# Patient Record
Sex: Female | Born: 1990 | Race: White | Hispanic: No | Marital: Married | Smoking: Never smoker
Health system: Southern US, Community
[De-identification: ages and names within clinical notes are randomized; demographics above are authoritative.]

## PROBLEM LIST (undated history)

## (undated) DIAGNOSIS — Z789 Other specified health status: Secondary | ICD-10-CM

## (undated) HISTORY — PX: LYMPH GLAND EXCISION: SHX13

## (undated) HISTORY — PX: ANKLE SURGERY: SHX546

---

## 2017-04-15 ENCOUNTER — Inpatient Hospital Stay (HOSPITAL_COMMUNITY): Payer: BLUE CROSS/BLUE SHIELD

## 2017-04-15 ENCOUNTER — Encounter (HOSPITAL_COMMUNITY): Payer: Self-pay | Admitting: *Deleted

## 2017-04-15 ENCOUNTER — Inpatient Hospital Stay (HOSPITAL_COMMUNITY)
Admission: AD | Admit: 2017-04-15 | Discharge: 2017-04-15 | Disposition: A | Discharge status: Home / Self Care | Payer: BLUE CROSS/BLUE SHIELD | Source: Ambulatory Visit | Attending: Obstetrics and Gynecology | Admitting: Obstetrics and Gynecology

## 2017-04-15 DIAGNOSIS — Z3A10 10 weeks gestation of pregnancy: Secondary | ICD-10-CM | POA: Diagnosis not present

## 2017-04-15 DIAGNOSIS — O4691 Antepartum hemorrhage, unspecified, first trimester: Secondary | ICD-10-CM | POA: Diagnosis present

## 2017-04-15 DIAGNOSIS — O039 Complete or unspecified spontaneous abortion without complication: Secondary | ICD-10-CM | POA: Diagnosis not present

## 2017-04-15 DIAGNOSIS — O209 Hemorrhage in early pregnancy, unspecified: Secondary | ICD-10-CM

## 2017-04-15 HISTORY — DX: Other specified health status: Z78.9

## 2017-04-15 LAB — CBC
HEMATOCRIT: 35.7 % — AB (ref 36.0–46.0)
HEMOGLOBIN: 12.6 g/dL (ref 12.0–15.0)
MCH: 32.1 pg (ref 26.0–34.0)
MCHC: 35.3 g/dL (ref 30.0–36.0)
MCV: 91.1 fL (ref 78.0–100.0)
Platelets: 247 10*3/uL (ref 150–400)
RBC: 3.92 MIL/uL (ref 3.87–5.11)
RDW: 12.9 % (ref 11.5–15.5)
WBC: 9.8 10*3/uL (ref 4.0–10.5)

## 2017-04-15 MED ORDER — IBUPROFEN 600 MG PO TABS
600.0000 mg | ORAL_TABLET | Freq: Once | ORAL | Status: AC
  Administered 2017-04-15: 600 mg via ORAL
  Filled 2017-04-15: qty 1

## 2017-04-15 MED ORDER — RHO D IMMUNE GLOBULIN 1500 UNIT/2ML IJ SOSY
300.0000 ug | PREFILLED_SYRINGE | Freq: Once | INTRAMUSCULAR | Status: AC
  Administered 2017-04-15: 300 ug via INTRAMUSCULAR
  Filled 2017-04-15: qty 2

## 2017-04-15 NOTE — MAU Provider Note (Signed)
[facility] AT [facility] Provider Note   CSN: [id number] Arrival date & time: 04/15/17  1558     History   Chief Complaint Chief Complaint  Patient presents with  . Vaginal Bleeding    HPI Emily Long is a 26 y.o. G1P0 @ [redacted]w[redacted]d presents to the MAU with vaginal bleeding. Patient reports that she had intercourse last night and this morning bleeding and cramping started. She reports passing large clots. Patient reports that she had spotting at [redacted] weeks gestation and received Rhogam at that time.   Vaginal Bleeding  The patient's primary symptoms include pelvic pain and vaginal bleeding. This is a new problem. The current episode started today. The problem occurs constantly. The problem has been gradually worsening. The pain is moderate. The problem affects both sides. She is pregnant. Associated symptoms include abdominal pain and back pain. Pertinent negatives include no chills, dysuria, fever, frequency, headaches, nausea, rash, urgency or vomiting. She has been passing clots. She has not been passing tissue. Nothing aggravates the symptoms. She has tried nothing for the symptoms. She is sexually active. No, her partner does not have an STD. She uses nothing for contraception.    Past Medical History:  Diagnosis Date  . Medical history non-contributory     There are no active problems to display for this patient.   Past Surgical History:  Procedure Laterality Date  . ANKLE SURGERY    . LYMPH GLAND EXCISION Right     OB History    Gravida Para Term Preterm AB Living   1             SAB TAB Ectopic Multiple Live Births                   Home Medications    Prior to Admission medications   Medication Sig Start Date End Date Taking? Authorizing Provider  acetaminophen (TYLENOL) 325 MG tablet Take 650 mg by mouth every 6 (six) hours as needed for mild pain.   Yes [provider]  DICLEGIS 10-10 MG TBEC TAKE 1 TABLET IN MORNING, 1 TABLET MID-DAY AND 2 TABLETS BY MOUTH AT  BEDTIME 03/22/17  Yes [provider]  Prenatal MV & Min w/FA-DHA (PRENATAL ADULT GUMMY/DHA/FA PO) Take 2 tablets by mouth daily.   Yes [provider]    Family History Family History  Problem Relation Age of Onset  . Cancer Maternal Grandmother   . Cancer Maternal Grandfather   . Cancer Paternal Grandmother     Social History Social History  Substance Use Topics  . Smoking status: Never Smoker  . Smokeless tobacco: Never Used  . Alcohol use No     Allergies   Patient has no known allergies.   Review of Systems Review of Systems  Constitutional: Negative for chills and fever.  Respiratory: Negative.   Cardiovascular: Negative for chest pain.  Gastrointestinal: Positive for abdominal pain. Negative for nausea and vomiting.  Genitourinary: Positive for pelvic pain and vaginal bleeding. Negative for difficulty urinating, dysuria, frequency and urgency.  Musculoskeletal: Positive for back pain.  Skin: Negative for rash.  Neurological: Negative for dizziness, syncope and headaches.  Psychiatric/Behavioral: The patient is not nervous/anxious.      Physical Exam Updated Vital Signs BP 128/73 (BP Location: Right Arm)   Pulse 90   Temp 98.9 F (37.2 C) (Oral)   Resp 16   SpO2 100%   Physical Exam  Constitutional: She appears well-developed and well-nourished.  HENT:  Head: Normocephalic.  Eyes: EOM are normal.  Neck: Neck supple.  Cardiovascular: Normal rate and regular rhythm.   Pulmonary/Chest: Effort normal.  Abdominal: Soft. Bowel sounds are normal.  Minimal tenderness with deep palpation lower abdomen.   Genitourinary:  Genitourinary Comments: External genitalia without lesions, moderate blood vaginal vault, no CMT, no adnexal tenderness, uterus slightly enlarged.   Musculoskeletal: Normal range of motion.  Neurological: She is alert.  Skin: Skin is warm and dry.  Psychiatric: She has a normal mood and affect. Her behavior is normal.    Nursing note and vitals reviewed.    ED Treatments / Results  Labs (all labs ordered are listed, but only abnormal results are displayed) Labs Reviewed  CBC - Abnormal; Notable for the following:       Result Value   HCT 35.7 (*)    All other components within normal limits  RH IG WORKUP (INCLUDES ABO/RH)    Radiology [place] Ob Comp Less 14 Wks  Result Date: 04/15/2017 CLINICAL DATA:  Vaginal bleeding with positive pregnancy test. EXAM: OBSTETRIC <14 WK US AND TRANSVAGINAL OB US TECHNIQUE: Both transabdominal and transvaginal ultrasound examinations were performed for complete evaluation of the gestation as well as the maternal uterus, adnexal regions, and pelvic cul-de-sac. Transvaginal technique was performed to assess early pregnancy. COMPARISON:  None. FINDINGS: Intrauterine gestational sac: Not visualized. Yolk sac:  Not visualized. Embryo:  Not visualized. Cardiac Activity: Nonvisualized. Subchorionic hemorrhage:  None visualized. Maternal uterus/adnexae: Endometrium appears thickened and irregular, potentially related to blood products. Maternal ovaries are normal in appearance. No evidence for adnexal mass. Trace amount of free fluid identified in the cul-de-sac. IMPRESSION: 1. No intrauterine gestation identified. Given the history of a positive pregnancy test, differential considerations include intrauterine gestation too early to visualize, completed abortion, or nonvisualized ectopic pregnancy. Close clinical correlation is recommended with serial beta-hCG and followup ultrasound as warranted. 2. Trace intraperitoneal free fluid. Electronically Signed   By: Kennith CenterEric  Mansell M.D.   On: 04/15/2017 17:11   [place] Ob Transvaginal  Result Date: 04/15/2017 CLINICAL DATA:  Vaginal bleeding with positive pregnancy test. EXAM: OBSTETRIC <14 WK US AND TRANSVAGINAL OB US TECHNIQUE: Both transabdominal and transvaginal ultrasound examinations were performed for complete evaluation of the gestation as  well as the maternal uterus, adnexal regions, and pelvic cul-de-sac. Transvaginal technique was performed to assess early pregnancy. COMPARISON:  None. FINDINGS: Intrauterine gestational sac: Not visualized. Yolk sac:  Not visualized. Embryo:  Not visualized. Cardiac Activity: Nonvisualized. Subchorionic hemorrhage:  None visualized. Maternal uterus/adnexae: Endometrium appears thickened and irregular, potentially related to blood products. Maternal ovaries are normal in appearance. No evidence for adnexal mass. Trace amount of free fluid identified in the cul-de-sac. IMPRESSION: 1. No intrauterine gestation identified. Given the history of a positive pregnancy test, differential considerations include intrauterine gestation too early to visualize, completed abortion, or nonvisualized ectopic pregnancy. Close clinical correlation is recommended with serial beta-hCG and followup ultrasound as warranted. 2. Trace intraperitoneal free fluid. Electronically Signed   By: Kennith CenterEric  Mansell M.D.   On: 04/15/2017 17:11    Procedures Procedures (including critical care time)  Medications Ordered in ED Medications  ibuprofen (ADVIL,MOTRIN) tablet 600 mg (600 mg Oral Given 04/15/17 1753)  rho (d) immune globulin (RHIG/RHOPHYLAC) injection 300 mcg (300 mcg Intramuscular Given 04/15/17 1827)     Initial Impression / Assessment and Plan / ED Course  I have reviewed the triage vital signs and the nursing notes.  Pertinent labs & imaging results that were available during my care  of the patient were reviewed by me and considered in my medical decision making (see chart for details).  5:30 pmConsult with Dr. Vincente Poli. Discussed clinical and ultrasound findings. Dr. Vincente Poli request patient have the Rhogam repeated even thought the patient reports having it at early gestational age in the office.   Final Clinical Impressions(s) / ED Diagnoses  26 y.o. female with heavy vaginal bleeding in first trimester pregnancy and  ultrasound that shows an empty uterus. Discussed findings with the patient and comfort pack given. Family in to see the patient.  Patient stable for d/c without heavy bleeding, she has normal vital signs. Patient agrees to f/u with her OB this week in the office. Return precautions discussed.  Final diagnoses:  Spontaneous miscarriage    New Prescriptions Current Discharge Medication List

## 2017-04-15 NOTE — MAU Note (Signed)
Pt reports cramping since this am, cramping is worsening. Bleeding since 7 am but became very heavy at 2 pm.

## 2017-04-16 LAB — RH IG WORKUP (INCLUDES ABO/RH)
ABO/RH(D): O NEG
ANTIBODY SCREEN: POSITIVE
DAT, IGG: NEGATIVE
GESTATIONAL AGE(WKS): 10
UNIT DIVISION: 0

## 2017-06-29 LAB — OB RESULTS CONSOLE RPR: RPR: NONREACTIVE

## 2017-06-29 LAB — OB RESULTS CONSOLE RUBELLA ANTIBODY, IGM: Rubella: IMMUNE

## 2017-06-29 LAB — OB RESULTS CONSOLE GC/CHLAMYDIA
CHLAMYDIA, DNA PROBE: NEGATIVE
Gonorrhea: NEGATIVE

## 2017-06-29 LAB — OB RESULTS CONSOLE ABO/RH: RH TYPE: NEGATIVE

## 2017-06-29 LAB — OB RESULTS CONSOLE HIV ANTIBODY (ROUTINE TESTING): HIV: NONREACTIVE

## 2017-06-29 LAB — OB RESULTS CONSOLE HEPATITIS B SURFACE ANTIGEN: HEP B S AG: NEGATIVE

## 2017-06-29 LAB — OB RESULTS CONSOLE ANTIBODY SCREEN: Antibody Screen: POSITIVE

## 2017-08-02 ENCOUNTER — Inpatient Hospital Stay (HOSPITAL_COMMUNITY)
Admission: AD | Admit: 2017-08-02 | Payer: BLUE CROSS/BLUE SHIELD | Source: Ambulatory Visit | Admitting: Obstetrics & Gynecology

## 2018-01-09 LAB — OB RESULTS CONSOLE GBS: GBS: NEGATIVE

## 2018-01-13 IMAGING — US US OB TRANSVAGINAL
1 series · 15 of 28 positions shown · non-contrast
Comparison: None.

CLINICAL DATA: Vaginal bleeding with positive pregnancy test.

EXAM:
OBSTETRIC <14 WK US AND TRANSVAGINAL OB US
TECHNIQUE: Both transabdominal and transvaginal ultrasound examinations were
performed for complete evaluation of the gestation as well as the
maternal uterus, adnexal regions, and pelvic cul-de-sac.
Transvaginal technique was performed to assess early pregnancy.

[Series 1: us ob transvaginal · 15 of 47 slices shown]
[im 1/47]
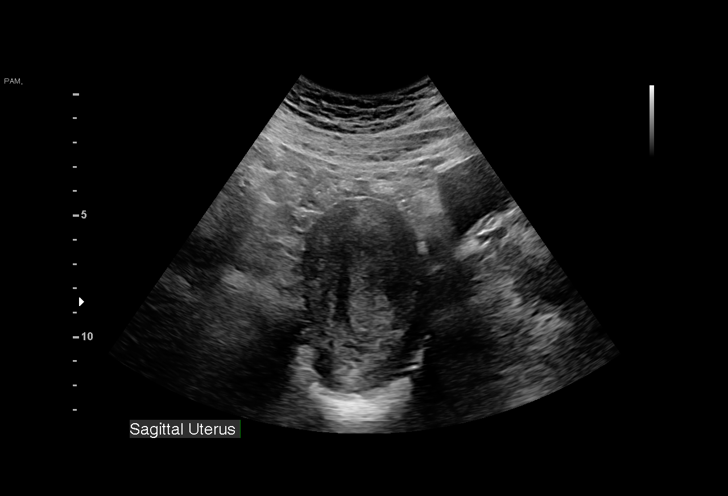
[im 4/47]
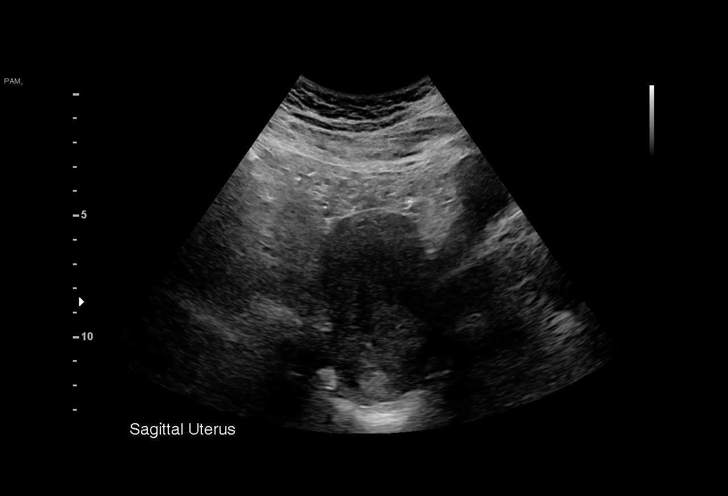
[im 7/47]
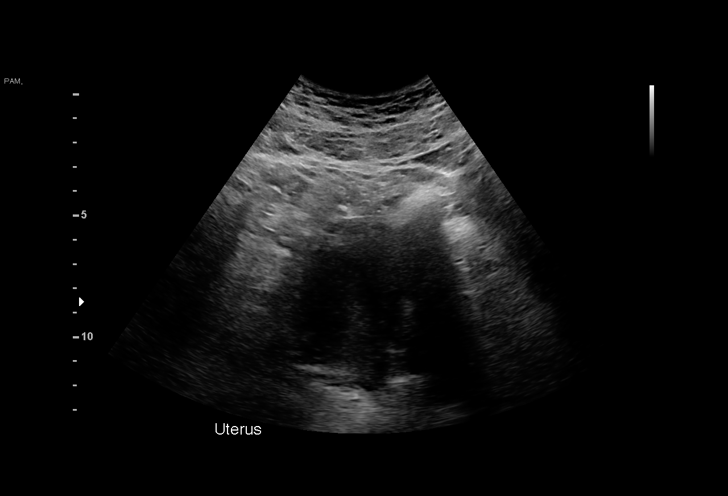
[im 11/47]
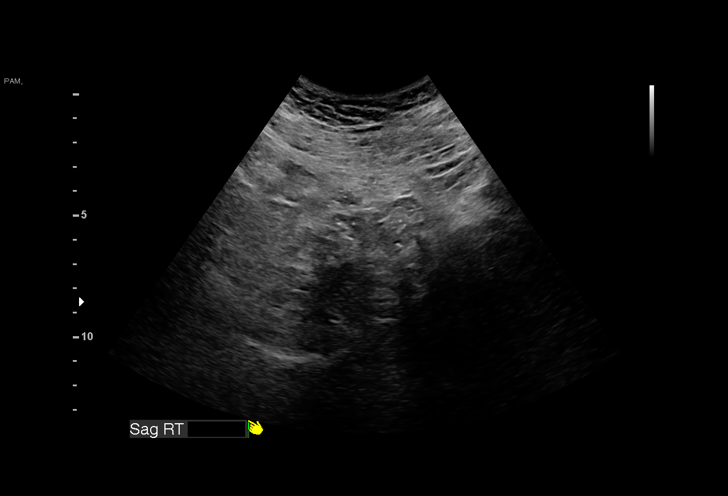
[im 14/47]
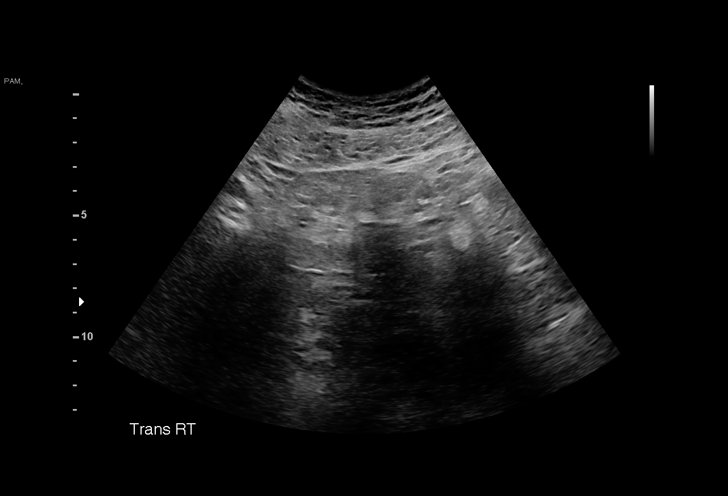
[im 18/47]
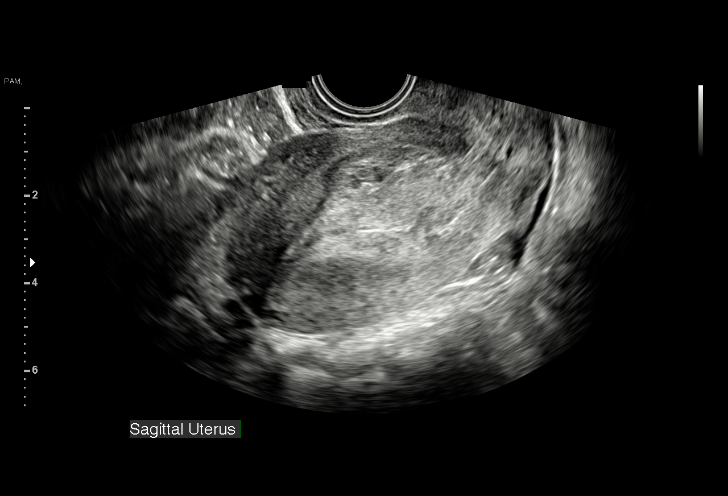
[im 21/47]
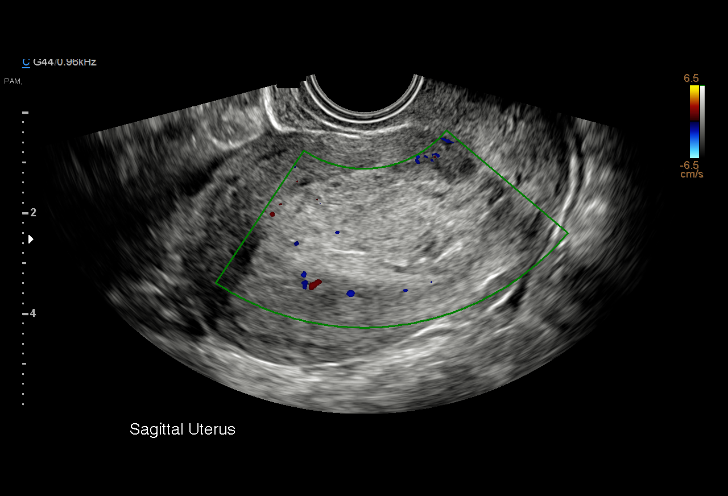
[im 24/47]
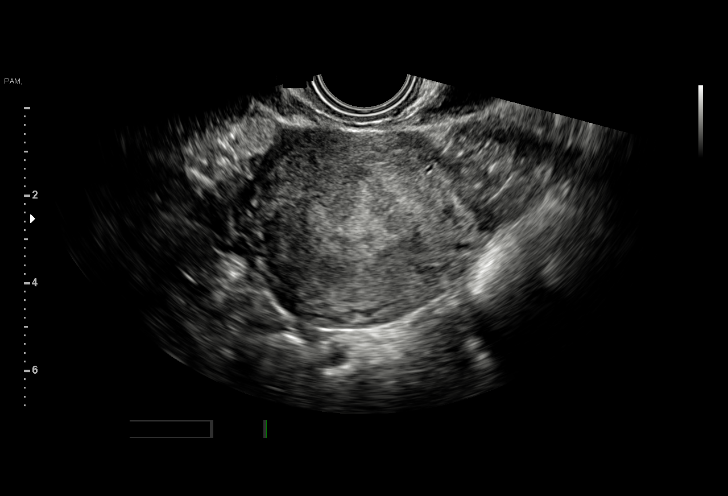
[im 26/47]
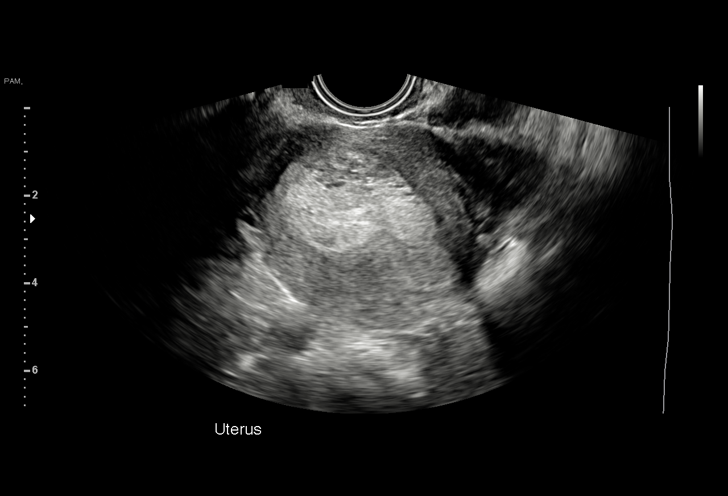
[im 29/47]
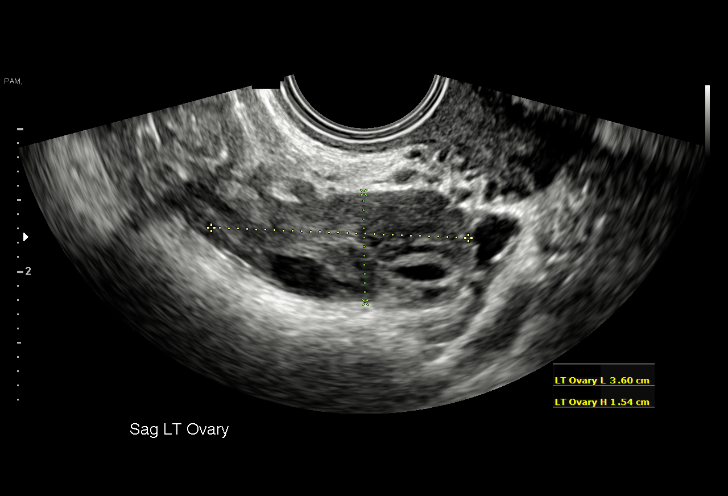
[im 33/47]
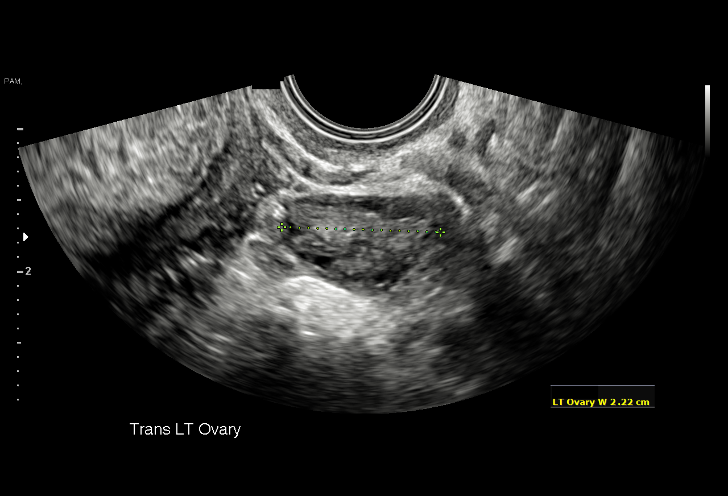
[im 36/47]
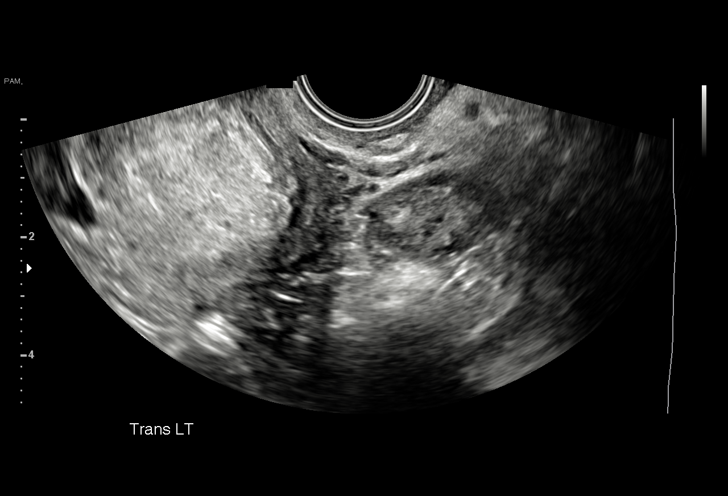
[im 40/47]
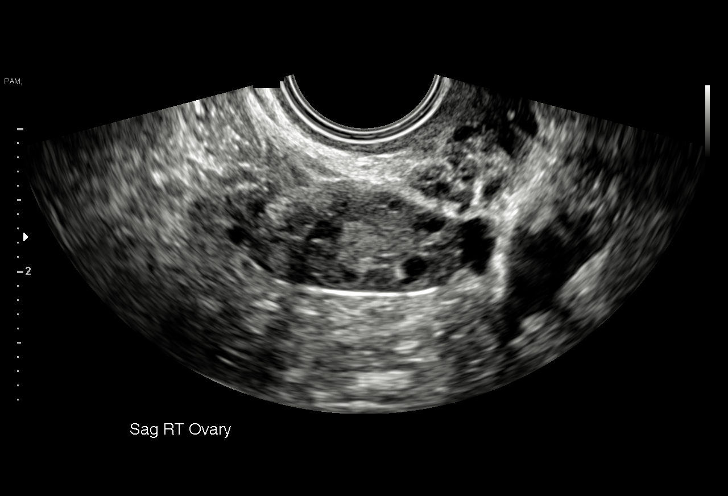
[im 43/47]
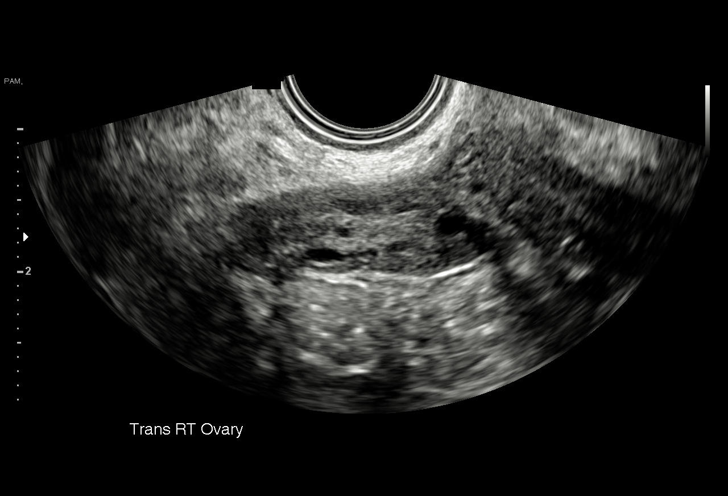
[im 47/47]
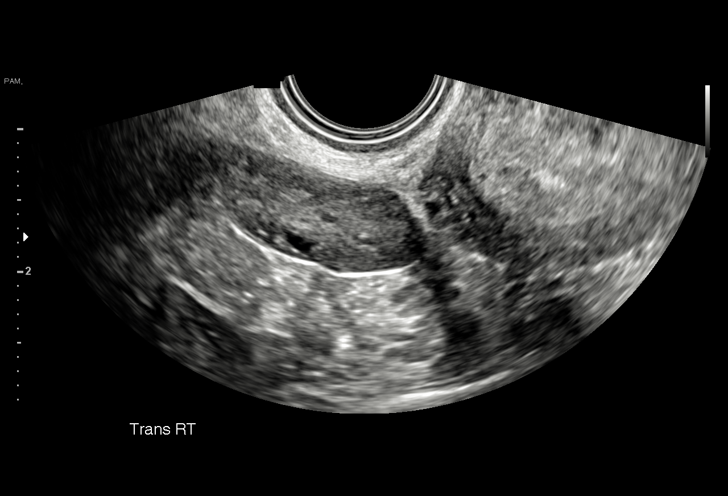

[15 of 28 positions shown; findings below may reference images not displayed]

FINDINGS: Intrauterine gestational sac: Not visualized.

Yolk sac:  Not visualized.

Embryo:  Not visualized.

Cardiac Activity: Nonvisualized.

Subchorionic hemorrhage:  None visualized.

Maternal uterus/adnexae: Endometrium appears thickened and
irregular, potentially related to blood products. Maternal ovaries
are normal in appearance. No evidence for adnexal mass. Trace amount
of free fluid identified in the cul-de-sac.
IMPRESSION: 1. No intrauterine gestation identified. Given the history of a
positive pregnancy test, differential considerations include
intrauterine gestation too early to visualize, completed abortion,
or nonvisualized ectopic pregnancy. Close clinical correlation is
recommended with serial beta-hCG and followup ultrasound as
warranted.
2. Trace intraperitoneal free fluid.

## 2018-01-15 ENCOUNTER — Encounter (HOSPITAL_COMMUNITY): Payer: Self-pay | Admitting: *Deleted

## 2018-01-28 NOTE — Patient Instructions (Signed)
AISSATA WILMORE  01/28/2018   Your procedure is scheduled on:  01/30/2018  Enter through the Main Entrance of [facility] at 1100 AM.  Pick up the phone at the desk and dial [phone]  Call this number if you have problems the morning of surgery:[phone]  Remember:   Do not eat food:(After Midnight) Desps de medianoche.  Do not drink clear liquids: (After Midnight) Desps de medianoche.  Take these medicines the morning of surgery with A SIP OF WATER: none   Do not wear jewelry, make-up or nail polish.  Do not wear lotions, powders, or perfumes. Do not wear deodorant.  Do not shave 48 hours prior to surgery.  Do not bring valuables to the hospital.  [facility] is not   responsible for any belongings or valuables brought to the hospital.  Contacts, dentures or bridgework may not be worn into surgery.  Leave suitcase in the car. After surgery it may be brought to your room.  For patients admitted to the hospital, checkout time is 11:00 AM the day of              discharge.    N/A   Please read over the following fact sheets that you were given:   Surgical Site Infection Prevention

## 2018-01-29 ENCOUNTER — Encounter (HOSPITAL_COMMUNITY)
Admission: RE | Admit: 2018-01-29 | Discharge: 2018-01-29 | Disposition: A | Discharge status: Home / Self Care | Payer: BLUE CROSS/BLUE SHIELD | Source: Ambulatory Visit | Attending: Obstetrics & Gynecology | Admitting: Obstetrics & Gynecology

## 2018-01-29 LAB — CBC
HEMATOCRIT: 35.5 % — AB (ref 36.0–46.0)
Hemoglobin: 12.1 g/dL (ref 12.0–15.0)
MCH: 31.3 pg (ref 26.0–34.0)
MCHC: 34.1 g/dL (ref 30.0–36.0)
MCV: 91.7 fL (ref 78.0–100.0)
PLATELETS: 260 10*3/uL (ref 150–400)
RBC: 3.87 MIL/uL (ref 3.87–5.11)
RDW: 13.5 % (ref 11.5–15.5)
WBC: 9.6 10*3/uL (ref 4.0–10.5)

## 2018-01-29 NOTE — H&P (Signed)
Emily Long is a 27 y.o. female G1 at 4 weeks presenting for primary cesarean section secondary to breech presentation; declined ECV.  Antepartum course uncomplicated.  Rh negative.  GBS negative.  OB History    Gravida  2   Para      Term      Preterm      AB  1   Living        SAB  1   TAB      Ectopic      Multiple      Live Births             Past Medical History:  Diagnosis Date  . Medical history non-contributory    Past Surgical History:  Procedure Laterality Date  . ANKLE SURGERY    . LYMPH GLAND EXCISION Right    Family History: family history includes Cancer in her maternal grandfather, maternal grandmother, and paternal grandmother; Heart disease in her paternal grandfather. Social History:  reports that she has never smoked. She has never used smokeless tobacco. She reports that she does not drink alcohol or use drugs.     Maternal Diabetes: No Genetic Screening: Normal Maternal Ultrasounds/Referrals: Normal Fetal Ultrasounds or other Referrals:  None Maternal Substance Abuse:  No Significant Maternal Medications:  None Significant Maternal Lab Results:  Lab values include: Group B Strep negative, Rh negative Other Comments:  None  ROS Maternal Medical History:  Prenatal complications: no prenatal complications Prenatal Complications - Diabetes: none.      unknown if currently breastfeeding. Maternal Exam:  Abdomen: Patient reports no abdominal tenderness. Fundal height is c/w dates.   Estimated fetal weight is 7#8.   Fetal presentation: breech     Physical Exam  Constitutional: She is oriented to person, place, and time. She appears well-developed and well-nourished.  GI: Soft. There is no rebound and no guarding.  Neurological: She is alert and oriented to person, place, and time.  Skin: Skin is warm and dry.  Psychiatric: She has a normal mood and affect. Her behavior is normal.    Prenatal labs: ABO, Rh: --/--/O NEG  (05/07 1115) Antibody: POS (05/07 1115) Rubella: Immune (10/05 0000) RPR: Nonreactive (10/05 0000)  HBsAg: Negative (10/05 0000)  HIV: Non-reactive (10/05 0000)  GBS:     Assessment/Plan: 27yo G1 at 39 weeks with breech presentation -Primary C/S -Patient is counseled re: risk of bleeding, infection, scarring, and damage to surrounding structures.  She understands the 1% risk of uterine rupture in subsequent pregnancies.  All questions were answered and the patient wishes to proceed.   Emily Long 01/29/2018, 1:49 PM

## 2018-01-30 ENCOUNTER — Inpatient Hospital Stay (HOSPITAL_COMMUNITY)
Admission: RE | Admit: 2018-01-30 | Discharge: 2018-02-01 | DRG: 788 | Disposition: A | Discharge status: Home / Self Care | Payer: BLUE CROSS/BLUE SHIELD | Source: Ambulatory Visit | Attending: Obstetrics & Gynecology | Admitting: Obstetrics & Gynecology

## 2018-01-30 ENCOUNTER — Encounter (HOSPITAL_COMMUNITY): Payer: Self-pay | Admitting: *Deleted

## 2018-01-30 ENCOUNTER — Inpatient Hospital Stay (HOSPITAL_COMMUNITY): Payer: BLUE CROSS/BLUE SHIELD | Admitting: Certified Registered Nurse Anesthetist

## 2018-01-30 ENCOUNTER — Encounter (HOSPITAL_COMMUNITY)
Admission: RE | Disposition: A | Discharge status: Home / Self Care | Payer: Self-pay | Source: Ambulatory Visit | Attending: Obstetrics & Gynecology

## 2018-01-30 DIAGNOSIS — Z6791 Unspecified blood type, Rh negative: Secondary | ICD-10-CM | POA: Diagnosis not present

## 2018-01-30 DIAGNOSIS — O321XX0 Maternal care for breech presentation, not applicable or unspecified: Secondary | ICD-10-CM | POA: Diagnosis present

## 2018-01-30 DIAGNOSIS — O26893 Other specified pregnancy related conditions, third trimester: Secondary | ICD-10-CM | POA: Diagnosis present

## 2018-01-30 DIAGNOSIS — L918 Other hypertrophic disorders of the skin: Secondary | ICD-10-CM | POA: Diagnosis present

## 2018-01-30 DIAGNOSIS — Z3A39 39 weeks gestation of pregnancy: Secondary | ICD-10-CM | POA: Diagnosis not present

## 2018-01-30 DIAGNOSIS — Z98891 History of uterine scar from previous surgery: Secondary | ICD-10-CM

## 2018-01-30 LAB — RPR: RPR Ser Ql: NONREACTIVE

## 2018-01-30 SURGERY — Surgical Case
Anesthesia: Spinal

## 2018-01-30 MED ORDER — LACTATED RINGERS IV SOLN
INTRAVENOUS | Status: DC
  Administered 2018-01-30: 23:00:00 via INTRAVENOUS

## 2018-01-30 MED ORDER — OXYTOCIN 10 UNIT/ML IJ SOLN
INTRAVENOUS | Status: DC | PRN
  Administered 2018-01-30: 40 [IU] via INTRAVENOUS

## 2018-01-30 MED ORDER — DIPHENHYDRAMINE HCL 25 MG PO CAPS: 25.0000 mg | ORAL_CAPSULE | ORAL | Status: DC | PRN

## 2018-01-30 MED ORDER — NALOXONE HCL 4 MG/10ML IJ SOLN
1.0000 ug/kg/h | INTRAVENOUS | Status: DC | PRN
  Filled 2018-01-30: qty 5

## 2018-01-30 MED ORDER — LACTATED RINGERS IV SOLN
INTRAVENOUS | Status: DC | PRN
  Administered 2018-01-30: 14:00:00 via INTRAVENOUS

## 2018-01-30 MED ORDER — DEXAMETHASONE SODIUM PHOSPHATE 4 MG/ML IJ SOLN
INTRAMUSCULAR | Status: AC
  Filled 2018-01-30: qty 1

## 2018-01-30 MED ORDER — DIPHENHYDRAMINE HCL 25 MG PO CAPS: 25.0000 mg | ORAL_CAPSULE | Freq: Four times a day (QID) | ORAL | Status: DC | PRN

## 2018-01-30 MED ORDER — SCOPOLAMINE 1 MG/3DAYS TD PT72
MEDICATED_PATCH | TRANSDERMAL | Status: AC
  Filled 2018-01-30: qty 1

## 2018-01-30 MED ORDER — SIMETHICONE 80 MG PO CHEW
80.0000 mg | CHEWABLE_TABLET | ORAL | Status: DC | PRN
  Filled 2018-01-30: qty 1

## 2018-01-30 MED ORDER — MEPERIDINE HCL 25 MG/ML IJ SOLN: 6.2500 mg | INTRAMUSCULAR | Status: DC | PRN

## 2018-01-30 MED ORDER — OXYTOCIN 40 UNITS IN LACTATED RINGERS INFUSION - SIMPLE MED: 2.5000 [IU]/h | INTRAVENOUS | Status: AC

## 2018-01-30 MED ORDER — IBUPROFEN 600 MG PO TABS
600.0000 mg | ORAL_TABLET | Freq: Four times a day (QID) | ORAL | Status: DC
  Administered 2018-01-30 – 2018-02-01 (×7): 600 mg via ORAL
  Filled 2018-01-30 (×8): qty 1

## 2018-01-30 MED ORDER — ONDANSETRON HCL 4 MG/2ML IJ SOLN: 4.0000 mg | Freq: Three times a day (TID) | INTRAMUSCULAR | Status: DC | PRN

## 2018-01-30 MED ORDER — LACTATED RINGERS IV SOLN: INTRAVENOUS | Status: DC | PRN

## 2018-01-30 MED ORDER — DIPHENHYDRAMINE HCL 50 MG/ML IJ SOLN: 12.5000 mg | INTRAMUSCULAR | Status: DC | PRN

## 2018-01-30 MED ORDER — DEXAMETHASONE SODIUM PHOSPHATE 4 MG/ML IJ SOLN
INTRAMUSCULAR | Status: DC | PRN
  Administered 2018-01-30: 4 mg via INTRAVENOUS

## 2018-01-30 MED ORDER — MORPHINE SULFATE (PF) 0.5 MG/ML IJ SOLN
INTRAMUSCULAR | Status: AC
  Filled 2018-01-30: qty 10

## 2018-01-30 MED ORDER — SILVER NITRATE-POT NITRATE 75-25 % EX MISC
CUTANEOUS | Status: AC
  Filled 2018-01-30: qty 1

## 2018-01-30 MED ORDER — NALBUPHINE HCL 10 MG/ML IJ SOLN: 5.0000 mg | Freq: Once | INTRAMUSCULAR | Status: DC | PRN

## 2018-01-30 MED ORDER — COCONUT OIL OIL: 1.0000 "application " | TOPICAL_OIL | Status: DC | PRN

## 2018-01-30 MED ORDER — KETOROLAC TROMETHAMINE 30 MG/ML IJ SOLN: 30.0000 mg | Freq: Once | INTRAMUSCULAR | Status: DC | PRN

## 2018-01-30 MED ORDER — SODIUM CHLORIDE 0.9% FLUSH: 3.0000 mL | INTRAVENOUS | Status: DC | PRN

## 2018-01-30 MED ORDER — OXYTOCIN 10 UNIT/ML IJ SOLN
INTRAMUSCULAR | Status: AC
  Filled 2018-01-30: qty 4

## 2018-01-30 MED ORDER — ONDANSETRON HCL 4 MG/2ML IJ SOLN
INTRAMUSCULAR | Status: AC
  Filled 2018-01-30: qty 2

## 2018-01-30 MED ORDER — DIBUCAINE 1 % RE OINT: 1.0000 "application " | TOPICAL_OINTMENT | RECTAL | Status: DC | PRN

## 2018-01-30 MED ORDER — ONDANSETRON HCL 4 MG/2ML IJ SOLN
INTRAMUSCULAR | Status: DC | PRN
  Administered 2018-01-30: 4 mg via INTRAVENOUS

## 2018-01-30 MED ORDER — OXYCODONE-ACETAMINOPHEN 5-325 MG PO TABS: 1.0000 | ORAL_TABLET | ORAL | Status: DC | PRN

## 2018-01-30 MED ORDER — PHENYLEPHRINE 8 MG IN D5W 100 ML (0.08MG/ML) PREMIX OPTIME
INJECTION | INTRAVENOUS | Status: DC | PRN
  Administered 2018-01-30: 60 ug/min via INTRAVENOUS

## 2018-01-30 MED ORDER — PROMETHAZINE HCL 25 MG/ML IJ SOLN: 6.2500 mg | INTRAMUSCULAR | Status: DC | PRN

## 2018-01-30 MED ORDER — HYDROMORPHONE HCL 1 MG/ML IJ SOLN: 0.2500 mg | INTRAMUSCULAR | Status: DC | PRN

## 2018-01-30 MED ORDER — SIMETHICONE 80 MG PO CHEW
80.0000 mg | CHEWABLE_TABLET | Freq: Three times a day (TID) | ORAL | Status: DC
  Administered 2018-01-31 – 2018-02-01 (×6): 80 mg via ORAL
  Filled 2018-01-30 (×5): qty 1

## 2018-01-30 MED ORDER — WITCH HAZEL-GLYCERIN EX PADS: 1.0000 "application " | MEDICATED_PAD | CUTANEOUS | Status: DC | PRN

## 2018-01-30 MED ORDER — NALOXONE HCL 0.4 MG/ML IJ SOLN: 0.4000 mg | INTRAMUSCULAR | Status: DC | PRN

## 2018-01-30 MED ORDER — CEFAZOLIN SODIUM-DEXTROSE 2-4 GM/100ML-% IV SOLN
2.0000 g | INTRAVENOUS | Status: AC
  Administered 2018-01-30: 2 g via INTRAVENOUS
  Filled 2018-01-30: qty 100

## 2018-01-30 MED ORDER — PRENATAL MULTIVITAMIN CH
1.0000 | ORAL_TABLET | Freq: Every day | ORAL | Status: DC
  Administered 2018-01-31 – 2018-02-01 (×2): 1 via ORAL
  Filled 2018-01-30 (×2): qty 1

## 2018-01-30 MED ORDER — SENNOSIDES-DOCUSATE SODIUM 8.6-50 MG PO TABS
2.0000 | ORAL_TABLET | ORAL | Status: DC
  Administered 2018-01-30: 2 via ORAL
  Filled 2018-01-30 (×2): qty 2

## 2018-01-30 MED ORDER — TETANUS-DIPHTH-ACELL PERTUSSIS 5-2.5-18.5 LF-MCG/0.5 IM SUSP: 0.5000 mL | Freq: Once | INTRAMUSCULAR | Status: DC

## 2018-01-30 MED ORDER — NALBUPHINE HCL 10 MG/ML IJ SOLN
5.0000 mg | INTRAMUSCULAR | Status: DC | PRN
  Administered 2018-01-30 – 2018-01-31 (×2): 5 mg via INTRAVENOUS
  Filled 2018-01-30 (×2): qty 1

## 2018-01-30 MED ORDER — ACETAMINOPHEN 325 MG PO TABS
650.0000 mg | ORAL_TABLET | ORAL | Status: DC | PRN
  Administered 2018-01-30 – 2018-02-01 (×6): 650 mg via ORAL
  Filled 2018-01-30 (×6): qty 2

## 2018-01-30 MED ORDER — SIMETHICONE 80 MG PO CHEW
80.0000 mg | CHEWABLE_TABLET | ORAL | Status: DC
  Administered 2018-01-30 – 2018-01-31 (×2): 80 mg via ORAL
  Filled 2018-01-30 (×2): qty 1

## 2018-01-30 MED ORDER — LACTATED RINGERS IV SOLN
INTRAVENOUS | Status: DC
  Administered 2018-01-30 (×2): via INTRAVENOUS

## 2018-01-30 MED ORDER — ZOLPIDEM TARTRATE 5 MG PO TABS: 5.0000 mg | ORAL_TABLET | Freq: Every evening | ORAL | Status: DC | PRN

## 2018-01-30 MED ORDER — NALBUPHINE HCL 10 MG/ML IJ SOLN: 5.0000 mg | INTRAMUSCULAR | Status: DC | PRN

## 2018-01-30 MED ORDER — OXYCODONE-ACETAMINOPHEN 5-325 MG PO TABS: 2.0000 | ORAL_TABLET | ORAL | Status: DC | PRN

## 2018-01-30 MED ORDER — SCOPOLAMINE 1 MG/3DAYS TD PT72
1.0000 | MEDICATED_PATCH | Freq: Once | TRANSDERMAL | Status: DC
  Administered 2018-01-30: 1.5 mg via TRANSDERMAL

## 2018-01-30 MED ORDER — MENTHOL 3 MG MT LOZG: 1.0000 | LOZENGE | OROMUCOSAL | Status: DC | PRN

## 2018-01-30 SURGICAL SUPPLY — 32 items
BENZOIN TINCTURE PRP APPL 2/3 (GAUZE/BANDAGES/DRESSINGS) ×3 IMPLANT
CHLORAPREP W/TINT 26ML (MISCELLANEOUS) ×3 IMPLANT
CLAMP CORD UMBIL (MISCELLANEOUS) IMPLANT
CLOSURE WOUND 1/2 X4 (GAUZE/BANDAGES/DRESSINGS) ×1
CLOTH BEACON ORANGE TIMEOUT ST (SAFETY) ×3 IMPLANT
DERMABOND ADVANCED (GAUZE/BANDAGES/DRESSINGS)
DERMABOND ADVANCED .7 DNX12 (GAUZE/BANDAGES/DRESSINGS) IMPLANT
DRSG OPSITE POSTOP 4X10 (GAUZE/BANDAGES/DRESSINGS) ×3 IMPLANT
ELECT REM PT RETURN 9FT ADLT (ELECTROSURGICAL) ×3
ELECTRODE REM PT RTRN 9FT ADLT (ELECTROSURGICAL) ×1 IMPLANT
EXTRACTOR VACUUM KIWI (MISCELLANEOUS) IMPLANT
GLOVE BIO SURGEON STRL SZ 6 (GLOVE) ×3 IMPLANT
GLOVE BIOGEL PI IND STRL 6 (GLOVE) ×2 IMPLANT
GLOVE BIOGEL PI IND STRL 7.0 (GLOVE) ×1 IMPLANT
GLOVE BIOGEL PI INDICATOR 6 (GLOVE) ×4
GLOVE BIOGEL PI INDICATOR 7.0 (GLOVE) ×2
GOWN STRL REUS W/TWL LRG LVL3 (GOWN DISPOSABLE) ×6 IMPLANT
KIT ABG SYR 3ML LUER SLIP (SYRINGE) ×3 IMPLANT
NEEDLE HYPO 25X5/8 SAFETYGLIDE (NEEDLE) ×3 IMPLANT
NS IRRIG 1000ML POUR BTL (IV SOLUTION) ×3 IMPLANT
PACK C SECTION WH (CUSTOM PROCEDURE TRAY) ×3 IMPLANT
PAD OB MATERNITY 4.3X12.25 (PERSONAL CARE ITEMS) ×3 IMPLANT
PENCIL SMOKE EVAC W/HOLSTER (ELECTROSURGICAL) ×3 IMPLANT
STRIP CLOSURE SKIN 1/2X4 (GAUZE/BANDAGES/DRESSINGS) ×2 IMPLANT
SUT CHROMIC 0 CTX 36 (SUTURE) ×9 IMPLANT
SUT MON AB 2-0 CT1 27 (SUTURE) ×3 IMPLANT
SUT PDS AB 0 CT1 27 (SUTURE) IMPLANT
SUT PLAIN 0 NONE (SUTURE) IMPLANT
SUT VIC AB 0 CT1 36 (SUTURE) IMPLANT
SUT VIC AB 4-0 KS 27 (SUTURE) IMPLANT
TOWEL OR 17X24 6PK STRL BLUE (TOWEL DISPOSABLE) ×3 IMPLANT
TRAY FOLEY W/BAG SLVR 14FR LF (SET/KITS/TRAYS/PACK) IMPLANT

## 2018-01-30 NOTE — Op Note (Signed)
Emily Long PROCEDURE DATE: 01/30/2018  PREOPERATIVE DIAGNOSIS: Intrauterine pregnancy at  [redacted]w[redacted]d weeks gestation, breech presentation  POSTOPERATIVE DIAGNOSIS: The same  PROCEDURE: Primary Low Transverse Cesarean Section, back-fill of bladder with sterile milk  SURGEON:  Dr. Mitchel Honour  INDICATIONS: Emily Long is a 27 y.o. G2P0010 at [redacted]w[redacted]d scheduled for cesarean section secondary to breech presentation.  The risks of cesarean section discussed with the patient included but were not limited to: bleeding which may require transfusion or reoperation; infection which may require antibiotics; injury to bowel, bladder, ureters or other surrounding organs; injury to the fetus; need for additional procedures including hysterectomy in the event of a life-threatening hemorrhage; placental abnormalities wth subsequent pregnancies, incisional problems, thromboembolic phenomenon and other postoperative/anesthesia complications. The patient concurred with the proposed plan, giving informed written consent for the procedure.  Additionally, the patient requests removed of right labial skin tag while spinal is in place.  FINDINGS:  Viable female infant in breech presentation, APGARs 9,9: weight pending  Clear amniotic fluid.  Intact placenta, three vessel cord.  Grossly normal uterus, ovaries and fallopian tubes. .   ANESTHESIA:  Spinal ESTIMATED BLOOD LOSS: 662 mL ml SPECIMENS: Placenta sent to L&D, right labial skin tag sent to pathology COMPLICATIONS: None immediate  PROCEDURE IN DETAIL:  The patient received intravenous antibiotics and had sequential compression devices applied to her lower extremities while in the preoperative area.  She was then taken to the operating room where spinal anesthesia was administered and was found to be adequate. She was then placed in a dorsal supine position with a leftward tilt, and prepped and draped in a sterile manner.  A foley catheter was placed into her bladder  and attached to constant gravity. While in the frog leg position and prepped, 1.5 cm right labial skin tag was removed sharply.  Base of skin tag was rendered hemostatic using silver nitrate.  The patient was positioned straight legged. Abdominal prep was performed.  After an adequate timeout was performed, a Pfannenstiel skin incision was made with scalpel and carried through to the underlying layer of fascia. The fascia was incised in the midline and this incision was extended bilaterally using the Mayo scissors. Kocher clamps were applied to the superior aspect of the fascial incision and the underlying rectus muscles were dissected off bluntly. A similar process was carried out on the inferior aspect of the facial incision. The rectus muscles were separated in the midline bluntly and the peritoneum was entered bluntly.   A transverse hysterotomy was made with a scalpel and extended bilaterally bluntly. The bladder blade was then removed. The infant was successfully delivered using typical breech maneuvers, and cord was clamped and cut and infant was handed over to awaiting neonatology team. Uterine massage was then administered and cord avulsion occurred.  The placenta was delivered manually and intact with three-vessel cord. The uterus was cleared of clot and debris.  The hysterotomy was closed with 0 chromic.  A second imbricating suture of 0-chromic was used to reinforce the incision and aid in hemostasis. Small defect questionably involving the bladder was noted and the decision was made to backfill the bladder to ensure no injury to the dome.  Backfill of 400cc sterile milk with no spill was performed. The peritoneum and rectus muscles were noted to be hemostatic and were reapproximated uwing 3-0 monocryl in a running fashion.  The fascia was closed with 0-Vicryl in a running fashion with good restoration of anatomy.  The subcutaneus tissue  was copiously irrigated and reapproximated using three interrupted  plain gut stitches.  The skin was closed with 4-0 vicryl in a subcuticular fashion.  Pt tolerated the procedure will.  All counts were correct x2.  Pt went to the recovery room in stable condition.

## 2018-01-30 NOTE — Transfer of Care (Signed)
Immediate Anesthesia Transfer of Care Note  Patient: Emily Long  Procedure(s) Performed: PRIMARY CESAREAN SECTION (N/A )  Patient Location: PACU  Anesthesia Type:Spinal  Level of Consciousness: awake, alert  and oriented  Airway & Oxygen Therapy: Patient Spontanous Breathing  Post-op Assessment: Report given to RN and Post -op Vital signs reviewed and stable  Post vital signs: Reviewed and stable  Last Vitals:  Vitals Value Taken Time  BP 125/66 01/30/2018  2:30 PM  Temp    Pulse    Resp 17 01/30/2018  2:33 PM  SpO2    Vitals shown include unvalidated device data.  Last Pain:  Vitals:   01/30/18 1110  TempSrc: Oral         Complications: No apparent anesthesia complications

## 2018-01-30 NOTE — Progress Notes (Signed)
No change to H&P. Patient declines u/s to confirm position breech.  She reports head still LUQ and if vertex, would still want C/S.  Mitchel Honour, DO

## 2018-01-30 NOTE — Anesthesia Preprocedure Evaluation (Signed)
Anesthesia Evaluation  Patient identified by MRN, date of birth, ID band Patient awake    Reviewed: Allergy & Precautions, NPO status , Patient's Chart, lab work & pertinent test results  Airway Mallampati: II  TM Distance: >3 FB Neck ROM: Full    Dental no notable dental hx.    Pulmonary neg pulmonary ROS,    Pulmonary exam normal breath sounds clear to auscultation       Cardiovascular negative cardio ROS Normal cardiovascular exam Rhythm:Regular Rate:Normal     Neuro/Psych negative neurological ROS  negative psych ROS   GI/Hepatic negative GI ROS, Neg liver ROS,   Endo/Other  negative endocrine ROS  Renal/GU negative Renal ROS     Musculoskeletal negative musculoskeletal ROS (+)   Abdominal (+) + obese,   Peds  Hematology negative hematology ROS (+)   Anesthesia Other Findings   Reproductive/Obstetrics (+) Pregnancy                             Anesthesia Physical Anesthesia Plan  ASA: II  Anesthesia Plan: Spinal   Post-op Pain Management:    Induction:   PONV Risk Score and Plan:   Airway Management Planned:   Additional Equipment:   Intra-op Plan:   Post-operative Plan:   Informed Consent: I have reviewed the patients History and Physical, chart, labs and discussed the procedure including the risks, benefits and alternatives for the proposed anesthesia with the patient or authorized representative who has indicated his/her understanding and acceptance.   Dental advisory given  Plan Discussed with: CRNA  Anesthesia Plan Comments:         Anesthesia Quick Evaluation

## 2018-01-31 LAB — CBC
HCT: 31.2 % — ABNORMAL LOW (ref 36.0–46.0)
Hemoglobin: 10.5 g/dL — ABNORMAL LOW (ref 12.0–15.0)
MCH: 32 pg (ref 26.0–34.0)
MCHC: 33.7 g/dL (ref 30.0–36.0)
MCV: 95.1 fL (ref 78.0–100.0)
PLATELETS: 236 10*3/uL (ref 150–400)
RBC: 3.28 MIL/uL — AB (ref 3.87–5.11)
RDW: 13.3 % (ref 11.5–15.5)
WBC: 12.5 10*3/uL — ABNORMAL HIGH (ref 4.0–10.5)

## 2018-01-31 LAB — BIRTH TISSUE RECOVERY COLLECTION (PLACENTA DONATION)

## 2018-01-31 MED ORDER — RHO D IMMUNE GLOBULIN 1500 UNIT/2ML IJ SOSY
300.0000 ug | PREFILLED_SYRINGE | Freq: Once | INTRAMUSCULAR | Status: AC
  Administered 2018-01-31: 300 ug via INTRAMUSCULAR
  Filled 2018-01-31: qty 2

## 2018-01-31 MED ORDER — MORPHINE SULFATE (PF) 0.5 MG/ML IJ SOLN
INTRAMUSCULAR | Status: DC | PRN
  Administered 2018-01-30: .2 mg via EPIDURAL

## 2018-01-31 NOTE — Lactation Note (Signed)
This note was copied from a baby's chart. Lactation Consultation Note  Patient Name: Emily Long [id number] Date: 01/31/2018 Reason for consult: Initial assessment;1st time breastfeeding;Primapara;Term  P1 mother whose infant is now 70 hours old.    Mother awake and baby sleeping in bassinet as I arrived.  Mother states she feels like baby is latching well and has fed well from birth.  Her breasts are soft and non tender and she has no concerns.  There is no pain with the latch.    Encouraged STS, breast massage and hand expression and to watch for feeding cues.  Reviewed feeding cues.  Feed infant 8-12 times/24 hours or earlier if she shows cues.    Mother had some questions about pumping since she will be returning to work in 6 weeks.  All questions answered and she already has a DEBP at home.    Mom made aware of O/P services, breastfeeding support groups, community resources, and our phone # for post-discharge questions. Mother will call for assistance as needed.   Maternal Data Formula Feeding for Exclusion: No Has patient been taught Hand Expression?: Yes Does the patient have breastfeeding experience prior to this delivery?: No  Feeding    LATCH Score                   Interventions    Lactation Tools Discussed/Used WIC Program: No   Consult Status Consult Status: Follow-up Date: 02/01/18 Follow-up type: In-patient    [name] R [name] 01/31/2018, 12:39 AM

## 2018-01-31 NOTE — Progress Notes (Signed)
Subjective: Postpartum Day 1: Cesarean Delivery Patient reports tolerating PO and + flatus.    Objective: Vital signs in last 24 hours: Temp:  [97.6 F (36.4 C)-98.5 F (36.9 C)] 98 F (36.7 C) (05/09 0649) Pulse Rate:  [52-81] 68 (05/09 0649) Resp:  [15-19] 18 (05/09 0649) BP: (113-138)/(52-86) 128/65 (05/09 0649) SpO2:  [98 %-100 %] 99 % (05/09 0250) Weight:  [217 lb (98.4 kg)] 217 lb (98.4 kg) (05/08 1107)  Physical Exam:  General: alert, cooperative and no distress Lochia: appropriate Uterine Fundus: firm Incision: healing well DVT Evaluation: No evidence of DVT seen on physical exam.  Recent Labs    01/29/18 1115 01/31/18 0522  HGB 12.1 10.5*  HCT 35.5* 31.2*    Assessment/Plan: Status post Cesarean section. Doing well postoperatively.  Continue current care.  Roselle Locus II 01/31/2018, 8:14 AM

## 2018-01-31 NOTE — Lactation Note (Signed)
This note was copied from a baby's chart. Lactation Consultation Note  Patient Name: Emily Long Date: 01/31/2018 Reason for consult: Follow-up assessment Mom states baby falls asleep at breast.  Assisted with waking techniques and baby unwrapped from blankets.  Assisted with football hold on right and cross cradle on left. Breasts are compressible but nipples short.  Baby latches well with good compression. Instructed on breast massage during feeding.  Baby needed to be relatched a few times.  Breast shells given to wear.  Instructed on manual pump to pre pump prior to latch.  Encouraged to call for assist prn.  Maternal Data    Feeding Feeding Type: Breast Fed Length of feed: 10 min  LATCH Score Latch: Repeated attempts needed to sustain latch, nipple held in mouth throughout feeding, stimulation needed to elicit sucking reflex.  Audible Swallowing: A few with stimulation  Type of Nipple: Everted at rest and after stimulation(short)  Comfort (Breast/Nipple): Soft / non-tender  Hold (Positioning): Assistance needed to correctly position infant at breast and maintain latch.  LATCH Score: 7  Interventions Interventions: Assisted with latch;Breast compression;Adjust position;Breast massage;Support pillows;Position options;Shells;Hand pump  Lactation Tools Discussed/Used Tools: Shells   Consult Status Consult Status: Follow-up Date: 02/01/18 Follow-up type: In-patient    Huston Foley 01/31/2018, 2:32 PM

## 2018-01-31 NOTE — Anesthesia Postprocedure Evaluation (Signed)
Anesthesia Post Note  Patient: Emily Long  Procedure(s) Performed: PRIMARY CESAREAN SECTION (N/A )     Patient location during evaluation: PACU Anesthesia Type: Spinal Level of consciousness: oriented and awake and alert Pain management: pain level controlled Vital Signs Assessment: post-procedure vital signs reviewed and stable Respiratory status: spontaneous breathing, respiratory function stable and patient connected to nasal cannula oxygen Cardiovascular status: blood pressure returned to baseline and stable Postop Assessment: no headache, no backache and no apparent nausea or vomiting Anesthetic complications: no    Last Vitals:  Vitals:   01/31/18 0250 01/31/18 0649  BP: 121/72 128/65  Pulse: 64 68  Resp: 18 18  Temp: 36.7 C 36.7 C  SpO2: 99%     Last Pain:  Vitals:   01/31/18 0754  TempSrc:   PainSc: 0-No pain   Pain Goal:                 [employer]

## 2018-01-31 NOTE — Anesthesia Procedure Notes (Signed)
Spinal  Patient location during procedure: OR Staffing Anesthesiologist: Aleigh Grunden, MD Performed: anesthesiologist  Preanesthetic Checklist Completed: patient identified, site marked, surgical consent, pre-op evaluation, timeout performed, IV checked, risks and benefits discussed and monitors and equipment checked Spinal Block Patient position: sitting Prep: ChloraPrep and site prepped and draped Patient monitoring: heart rate, continuous pulse ox and blood pressure Approach: midline Location: L3-4 Injection technique: single-shot Needle Needle type: Sprotte  Needle gauge: 24 G Needle length: 9 cm Additional Notes Expiration date of kit checked and confirmed. Patient tolerated procedure well, without complications.       

## 2018-02-01 LAB — RH IG WORKUP (INCLUDES ABO/RH)
ABO/RH(D): O NEG
FETAL SCREEN: NEGATIVE
GESTATIONAL AGE(WKS): 39.1
Unit division: 0

## 2018-02-01 MED ORDER — IBUPROFEN 600 MG PO TABS: 600.0000 mg | ORAL_TABLET | Freq: Four times a day (QID) | ORAL | 0 refills | Status: AC

## 2018-02-01 MED ORDER — OXYCODONE-ACETAMINOPHEN 5-325 MG PO TABS: 1.0000 | ORAL_TABLET | ORAL | 0 refills | Status: AC | PRN

## 2018-02-01 NOTE — Progress Notes (Signed)
Subjective: Postpartum Day 2: Cesarean Delivery Patient reports tolerating PO, + flatus and no problems voiding.    Objective: Vital signs in last 24 hours: Temp:  [97.7 F (36.5 C)-98.8 F (37.1 C)] 98.1 F (36.7 C) (05/10 0542) Pulse Rate:  [68-71] 71 (05/10 0542) Resp:  [18] 18 (05/10 0542) BP: (114-117)/(63-69) 114/69 (05/10 0542) SpO2:  [99 %] 99 % (05/09 1117)  Physical Exam:  General: alert, cooperative and appears stated age Lochia: appropriate Uterine Fundus: firm Incision: healing well, no significant drainage, no dehiscence DVT Evaluation: No evidence of DVT seen on physical exam. Negative Homan's sign. No cords or calf tenderness.  Recent Labs    01/29/18 1115 01/31/18 0522  HGB 12.1 10.5*  HCT 35.5* 31.2*    Assessment/Plan: Status post Cesarean section. Doing well postoperatively.  Discharge home with standard precautions and return to clinic in 4-6 weeks.  Jonica Bickhart 02/01/2018, 9:52 AM

## 2018-02-01 NOTE — Lactation Note (Signed)
This note was copied from a baby's chart. Lactation Consultation Note  Patient Name: Girl Renesme Kerrigan [id number] Date: 02/01/2018  Mom reports feeding are going well.  Questions answered.  Discussed pumping for return to work.  Lactation outpatient services and support reviewed and encouraged prn.   Maternal Data    Feeding Feeding Type: Breast Fed Length of feed: 40 min  LATCH Score                   Interventions    Lactation Tools Discussed/Used     Consult Status      Huston Foley 02/01/2018, 12:42 PM

## 2018-02-01 NOTE — Discharge Instructions (Signed)
Call MD for T>100.4, heavy vaginal bleeding, severe abdominal pain, intractable nausea and/or vomiting, or respiratory distress.  Call office to schedule postop incision check in 1 week.  No driving while taking narcotics.  Pelvic rest x 6 weeks.   °

## 2018-02-01 NOTE — Discharge Summary (Signed)
Obstetric Discharge Summary Reason for Admission: cesarean section Prenatal Procedures: none Intrapartum Procedures: cesarean: low cervical, transverse Postpartum Procedures: none Complications-Operative and Postpartum: none Hemoglobin  Date Value Ref Range Status  01/31/2018 10.5 (L) 12.0 - 15.0 g/dL Final   HCT  Date Value Ref Range Status  01/31/2018 31.2 (L) 36.0 - 46.0 % Final    Physical Exam:  General: alert, cooperative and appears stated age 27: appropriate Uterine Fundus: firm Incision: healing well, no significant drainage, no dehiscence DVT Evaluation: No evidence of DVT seen on physical exam. Negative Homan's sign. No cords or calf tenderness.  Discharge Diagnoses: Term Pregnancy-delivered  Discharge Information: Date: 02/01/2018 Activity: pelvic rest Diet: routine Medications: PNV, Ibuprofen and Percocet Condition: stable Instructions: refer to practice specific booklet Discharge to: home   Newborn Data: Live born female  Birth Weight: 7 lb 9.2 oz (3435 g) APGAR: 9, 9  Newborn Delivery   Birth date/time:  01/30/2018 13:41:00 Delivery type:  C-Section, Low Transverse Trial of labor:  No C-section categorization:  Primary     Home with mother.  Holger Sokolowski 02/01/2018, 9:55 AM

## 2018-02-02 LAB — TYPE AND SCREEN
ABO/RH(D): O NEG
ANTIBODY SCREEN: POSITIVE
Unit division: 0
Unit division: 0

## 2018-02-02 LAB — BPAM RBC
BLOOD PRODUCT EXPIRATION DATE: 201905282359
BLOOD PRODUCT EXPIRATION DATE: 201905282359
UNIT TYPE AND RH: 9500
UNIT TYPE AND RH: 9500

## 2018-04-30 ENCOUNTER — Encounter (HOSPITAL_COMMUNITY): Payer: Self-pay
